# Patient Record
Sex: Female | Born: 1995 | Race: Black or African American | Hispanic: No | Marital: Single | Smoking: Never smoker
Health system: Southern US, Community
[De-identification: ages and names within clinical notes are randomized; demographics above are authoritative.]

---
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
## 2014-06-18 ENCOUNTER — Inpatient Hospital Stay (HOSPITAL_COMMUNITY)
Admission: AD | Admit: 2014-06-18 | Discharge: 2014-06-18 | Disposition: A | Discharge status: Home / Self Care | Payer: Medicaid Other | Source: Ambulatory Visit | Attending: Obstetrics and Gynecology | Admitting: Obstetrics and Gynecology

## 2014-06-18 DIAGNOSIS — R509 Fever, unspecified: Secondary | ICD-10-CM | POA: Insufficient documentation

## 2014-06-18 DIAGNOSIS — R112 Nausea with vomiting, unspecified: Secondary | ICD-10-CM | POA: Insufficient documentation

## 2014-06-18 DIAGNOSIS — K529 Noninfective gastroenteritis and colitis, unspecified: Secondary | ICD-10-CM

## 2014-06-18 LAB — CBC WITH DIFFERENTIAL/PLATELET
BASOS ABS: 0 10*3/uL (ref 0.0–0.1)
BASOS PCT: 0 % (ref 0–1)
EOS PCT: 0 % (ref 0–5)
Eosinophils Absolute: 0 10*3/uL (ref 0.0–0.7)
HCT: 40.4 % (ref 36.0–46.0)
HEMOGLOBIN: 13.7 g/dL (ref 12.0–15.0)
LYMPHS PCT: 8 % — AB (ref 12–46)
Lymphs Abs: 0.8 10*3/uL (ref 0.7–4.0)
MCH: 28.7 pg (ref 26.0–34.0)
MCHC: 33.9 g/dL (ref 30.0–36.0)
MCV: 84.5 fL (ref 78.0–100.0)
MONO ABS: 0.4 10*3/uL (ref 0.1–1.0)
MONOS PCT: 4 % (ref 3–12)
NEUTROS PCT: 88 % — AB (ref 43–77)
Neutro Abs: 8.5 10*3/uL — ABNORMAL HIGH (ref 1.7–7.7)
Platelets: 278 10*3/uL (ref 150–400)
RBC: 4.78 MIL/uL (ref 3.87–5.11)
RDW: 13.1 % (ref 11.5–15.5)
WBC: 9.7 10*3/uL (ref 4.0–10.5)

## 2014-06-18 LAB — COMPREHENSIVE METABOLIC PANEL
ALK PHOS: 66 U/L (ref 39–117)
ALT: 20 U/L (ref 0–35)
AST: 24 U/L (ref 0–37)
Albumin: 4 g/dL (ref 3.5–5.2)
Anion gap: 6 (ref 5–15)
BUN: 11 mg/dL (ref 6–23)
CALCIUM: 8.6 mg/dL (ref 8.4–10.5)
CHLORIDE: 104 mmol/L (ref 96–112)
CO2: 26 mmol/L (ref 19–32)
Creatinine, Ser: 0.97 mg/dL (ref 0.50–1.10)
GFR calc Af Amer: >90 mL/min (ref >90)
GFR, EST NON AFRICAN AMERICAN: 84 mL/min — AB (ref >90)
GLUCOSE: 99 mg/dL (ref 70–99)
POTASSIUM: 3.6 mmol/L (ref 3.5–5.1)
SODIUM: 136 mmol/L (ref 135–145)
TOTAL PROTEIN: 7.7 g/dL (ref 6.0–8.3)
Total Bilirubin: 0.8 mg/dL (ref 0.3–1.2)

## 2014-06-18 LAB — URINE MICROSCOPIC-ADD ON

## 2014-06-18 LAB — URINALYSIS, ROUTINE W REFLEX MICROSCOPIC
Bilirubin Urine: NEGATIVE
Glucose, UA: NEGATIVE mg/dL
HGB URINE DIPSTICK: NEGATIVE
Ketones, ur: NEGATIVE mg/dL
Leukocytes, UA: NEGATIVE
Nitrite: NEGATIVE
Protein, ur: 100 mg/dL — AB
SPECIFIC GRAVITY, URINE: 1.01 (ref 1.005–1.030)
UROBILINOGEN UA: 1 mg/dL (ref 0.0–1.0)
pH: >9 — ABNORMAL HIGH (ref 5.0–8.0)

## 2014-06-18 LAB — POCT PREGNANCY, URINE: Preg Test, Ur: NEGATIVE

## 2014-06-18 MED ORDER — ONDANSETRON 8 MG PO TBDP
8.0000 mg | ORAL_TABLET | Freq: Once | ORAL | Status: AC
  Administered 2014-06-18: 8 mg via ORAL
  Filled 2014-06-18: qty 1

## 2014-06-18 MED ORDER — ONDANSETRON 4 MG PO TBDP: 4.0000 mg | ORAL_TABLET | Freq: Four times a day (QID) | ORAL | Status: AC | PRN

## 2014-06-18 MED ORDER — ACETAMINOPHEN 325 MG PO TABS
650.0000 mg | ORAL_TABLET | Freq: Once | ORAL | Status: AC
  Administered 2014-06-18: 650 mg via ORAL
  Filled 2014-06-18: qty 2

## 2014-06-18 NOTE — MAU Note (Signed)
Pt states she has been throwing up all day, had diarrhea, HA, back pain, dizziness, cramping in right side, and SOB. LMP 05/31/2014. Pt took 2 Bayer Aspirin at 5pm, it did not help. Denies cough, sore throat or cold..Marland Kitchen

## 2014-06-18 NOTE — MAU Provider Note (Signed)
History     CSN: [id number]  Arrival date and time: 06/18/14 0134   None     Chief Complaint  Patient presents with  . Fever  . Emesis   HPI This is a 19 y.o. female who presents with c/o nausea, vomiting and fever since 2pm today. Had one episode of loose stool. No sick contacts or suspicious food.  Denies abdominal pain, though gets fleeting spasm feelings in epigastrum which last a few seconds then stop. No pain in lower abdomen. Denies gynecologic complaints.  RN Note:  Pt states she has been throwing up all day, had diarrhea, HA, back pain, dizziness, cramping in right side, and SOB. LMP 05/31/2014. Pt took 2 Bayer Aspirin at 5pm, it did not help. Denies cough, sore throat or cold..          OB History    No data available      No past medical history on file.  No past surgical history on file.  Family History  Problem Relation Age of Onset  . Diabetes Mother   . Hypertension Mother     History  Substance Use Topics  . Smoking status: Never Smoker   . Smokeless tobacco: Not on file  . Alcohol Use: No    Allergies: No Known Allergies  Prescriptions prior to admission  Medication Sig Dispense Refill Last Dose  . naproxen (NAPROSYN) 500 MG tablet Take 1 tablet (500 mg total) by mouth 2 (two) times daily. 30 tablet 0     Review of Systems  Constitutional: Positive for fever, chills and malaise/fatigue.  HENT: Negative for sore throat.   Respiratory: Negative for cough.   Cardiovascular: Negative for chest pain.  Gastrointestinal: Positive for nausea, vomiting and diarrhea (one stool). Negative for abdominal pain and constipation.  Neurological: Negative for dizziness.   Physical Exam   Blood pressure 121/70, pulse 117, temperature 102.9 F (39.4 C), temperature source Oral, resp. rate 22, height  (1.626 m), weight 200 lb (90.719 kg), SpO2 98 %.  Physical Exam  Constitutional: She is oriented to person, place, and time. She appears  well-developed. No distress.  HENT:  Head: Normocephalic.  Cardiovascular: Normal rate.   Respiratory: Effort normal.  GI: Soft. She exhibits no distension and no mass. There is no tenderness. There is no rebound and no guarding.  Musculoskeletal: Normal range of motion.  Neurological: She is alert and oriented to person, place, and time.  Skin: Skin is warm and dry.  Psychiatric: She has a normal mood and affect.    MAU Course  Procedures  MDM Will check labs and give Zofran then try sips. Has had no vomiting while here. Tylenol given for fever.  Results for orders placed or performed during the hospital encounter of 06/18/14 (from the past 24 hour(s))  Urinalysis, Routine w reflex microscopic     Status: Abnormal   Collection Time: 06/18/14  1:50 AM  Result Value Ref Range   Color, Urine YELLOW YELLOW   APPearance CLEAR CLEAR   Specific Gravity, Urine 1.010 1.005 - 1.030   pH >9.0 (H) 5.0 - 8.0   Glucose, UA NEGATIVE NEGATIVE mg/dL   Hgb urine dipstick NEGATIVE NEGATIVE   Bilirubin Urine NEGATIVE NEGATIVE   Ketones, ur NEGATIVE NEGATIVE mg/dL   Protein, ur 409 (A) NEGATIVE mg/dL   Urobilinogen, UA 1.0 0.0 - 1.0 mg/dL   Nitrite NEGATIVE NEGATIVE   Leukocytes, UA NEGATIVE NEGATIVE  Urine microscopic-add on     Status: None  Collection Time: 06/18/14  1:50 AM  Result Value Ref Range   Squamous Epithelial / LPF RARE RARE   WBC, UA 0-2 <3 WBC/hpf   RBC / HPF 0-2 <3 RBC/hpf   Bacteria, UA RARE RARE  Pregnancy, urine POC     Status: None   Collection Time: 06/18/14  1:56 AM  Result Value Ref Range   Preg Test, Ur NEGATIVE NEGATIVE  CBC with Differential     Status: Abnormal   Collection Time: 06/18/14  2:40 AM  Result Value Ref Range   WBC 9.7 4.0 - 10.5 K/uL   RBC 4.78 3.87 - 5.11 MIL/uL   Hemoglobin 13.7 12.0 - 15.0 g/dL   HCT 40.940.4 81.136.0 - 91.446.0 %   MCV 84.5 78.0 - 100.0 fL   MCH 28.7 26.0 - 34.0 pg   MCHC 33.9 30.0 - 36.0 g/dL   RDW 78.213.1 95.611.5 - 21.315.5 %   Platelets  278 150 - 400 K/uL   Neutrophils Relative % 88 (H) 43 - 77 %   Neutro Abs 8.5 (H) 1.7 - 7.7 K/uL   Lymphocytes Relative 8 (L) 12 - 46 %   Lymphs Abs 0.8 0.7 - 4.0 K/uL   Monocytes Relative 4 3 - 12 %   Monocytes Absolute 0.4 0.1 - 1.0 K/uL   Eosinophils Relative 0 0 - 5 %   Eosinophils Absolute 0.0 0.0 - 0.7 K/uL   Basophils Relative 0 0 - 1 %   Basophils Absolute 0.0 0.0 - 0.1 K/uL  Comprehensive metabolic panel     Status: Abnormal   Collection Time: 06/18/14  2:40 AM  Result Value Ref Range   Sodium 136 135 - 145 mmol/L   Potassium 3.6 3.5 - 5.1 mmol/L   Chloride 104 96 - 112 mmol/L   CO2 26 19 - 32 mmol/L   Glucose, Bld 99 70 - 99 mg/dL   BUN 11 6 - 23 mg/dL   Creatinine, Ser 0.860.97 0.50 - 1.10 mg/dL   Calcium 8.6 8.4 - 57.810.5 mg/dL   Total Protein 7.7 6.0 - 8.3 g/dL   Albumin 4.0 3.5 - 5.2 g/dL   AST 24 0 - 37 U/L   ALT 20 0 - 35 U/L   Alkaline Phosphatase 66 39 - 117 U/L   Total Bilirubin 0.8 0.3 - 1.2 mg/dL   GFR calc non Af Amer 84 (L) >90 mL/min   GFR calc Af Amer >90 >90 mL/min   Anion gap 6 5 - 15   Tolerated ginger ale well. No vomiting while here.  Fever down  Assessment and Plan  A:  Probable viral gastroenteritis  P:  Discussed expected course       Rx Zofran for PRN use at home        Reviewed she should improve in 2-3 days        If worsens or does not get better, go to Urgent Care or ED  [facility] CenterWILLIAMS,Stormy Connon 06/18/2014, 3:40 AM

## 2014-06-18 NOTE — Discharge Instructions (Signed)

## 2017-04-06 ENCOUNTER — Emergency Department (HOSPITAL_COMMUNITY)
Admission: EM | Admit: 2017-04-06 | Discharge: 2017-04-06 | Disposition: A | Discharge status: Home / Self Care | Payer: PPO | Attending: Emergency Medicine | Admitting: Emergency Medicine

## 2017-04-06 ENCOUNTER — Encounter (HOSPITAL_COMMUNITY): Payer: Self-pay | Admitting: Emergency Medicine

## 2017-04-06 DIAGNOSIS — Z202 Contact with and (suspected) exposure to infections with a predominantly sexual mode of transmission: Secondary | ICD-10-CM | POA: Insufficient documentation

## 2017-04-06 DIAGNOSIS — J020 Streptococcal pharyngitis: Secondary | ICD-10-CM | POA: Insufficient documentation

## 2017-04-06 DIAGNOSIS — J029 Acute pharyngitis, unspecified: Secondary | ICD-10-CM | POA: Diagnosis present

## 2017-04-06 DIAGNOSIS — Z79899 Other long term (current) drug therapy: Secondary | ICD-10-CM | POA: Insufficient documentation

## 2017-04-06 LAB — WET PREP, GENITAL
Sperm: NONE SEEN
TRICH WET PREP: NONE SEEN
Yeast Wet Prep HPF POC: NONE SEEN

## 2017-04-06 LAB — RAPID STREP SCREEN (MED CTR MEBANE ONLY): Streptococcus, Group A Screen (Direct): POSITIVE — AB

## 2017-04-06 MED ORDER — CEFTRIAXONE SODIUM 250 MG IJ SOLR
250.0000 mg | Freq: Once | INTRAMUSCULAR | Status: AC
  Administered 2017-04-06: 250 mg via INTRAMUSCULAR
  Filled 2017-04-06: qty 250

## 2017-04-06 MED ORDER — ONDANSETRON 4 MG PO TBDP
4.0000 mg | ORAL_TABLET | Freq: Once | ORAL | Status: AC
  Administered 2017-04-06: 4 mg via ORAL
  Filled 2017-04-06: qty 1

## 2017-04-06 MED ORDER — DEXAMETHASONE 4 MG PO TABS
10.0000 mg | ORAL_TABLET | Freq: Once | ORAL | Status: AC
  Administered 2017-04-06: 10 mg via ORAL
  Filled 2017-04-06: qty 2

## 2017-04-06 MED ORDER — LIDOCAINE HCL (PF) 1 % IJ SOLN
INTRAMUSCULAR | Status: AC
  Administered 2017-04-06: 1.5 mL
  Filled 2017-04-06: qty 5

## 2017-04-06 MED ORDER — AZITHROMYCIN 250 MG PO TABS
1000.0000 mg | ORAL_TABLET | Freq: Once | ORAL | Status: AC
  Administered 2017-04-06: 1000 mg via ORAL
  Filled 2017-04-06: qty 4

## 2017-04-06 MED ORDER — METRONIDAZOLE 500 MG PO TABS: 500.0000 mg | ORAL_TABLET | Freq: Two times a day (BID) | ORAL | 0 refills | Status: AC

## 2017-04-06 MED ORDER — PENICILLIN G BENZATHINE 1200000 UNIT/2ML IM SUSP
1.2000 10*6.[IU] | Freq: Once | INTRAMUSCULAR | Status: AC
  Administered 2017-04-06: 1.2 10*6.[IU] via INTRAMUSCULAR
  Filled 2017-04-06: qty 2

## 2017-04-06 NOTE — ED Provider Notes (Signed)
[facility]-EMERGENCY DEPT Provider Note   CSN: [id number] Arrival date & time: 04/06/17  1600     History   Chief Complaint Chief Complaint  Patient presents with  . Sore Throat  . Exposure to STD    HPI Connie Mckenzie is a 21 y.o. female who presents to the ED for STI screening and treatment. Patient's sex partner recently treated for STI. Patient denies any symptoms but wants to be treated. Patient does report abnormal vaginal bleeding this past week that she describes as spotting.   Patient also reports that she has a sore throat that started yesterday.   HPI  History reviewed. No pertinent past medical history.  There are no active problems to display for this patient.   History reviewed. No pertinent surgical history.  OB History    No data available       Home Medications    Prior to Admission medications   Medication Sig Start Date End Date Taking? Authorizing Provider  naproxen (NAPROSYN) 500 MG tablet Take 1 tablet (500 mg total) by mouth 2 (two) times daily. 06/09/13   Kirichenko, Tatyana, PA-C  ondansetron (ZOFRAN ODT) 4 MG disintegrating tablet Take 1 tablet (4 mg total) by mouth every 6 (six) hours as needed for nausea. 06/18/14   Aviva SignsWilliams, Marie L, CNM    Family History Family History  Problem Relation Age of Onset  . Diabetes Mother   . Hypertension Mother     Social History Social History   Tobacco Use  . Smoking status: Never Smoker  . Smokeless tobacco: Never Used  Substance Use Topics  . Alcohol use: No  . Drug use: Not on file     Allergies   Patient has no known allergies.   Review of Systems Review of Systems  Constitutional: Negative for fever.  HENT: Positive for sore throat.   Eyes: Negative for discharge and redness.  Respiratory: Negative for cough.   Cardiovascular: Negative for chest pain.  Gastrointestinal: Negative for abdominal pain, nausea and vomiting.  Genitourinary: Positive for vaginal  bleeding (spotting). Negative for dysuria and vaginal discharge.  Musculoskeletal: Positive for back pain.  Skin: Negative for rash.  Neurological: Negative for syncope and headaches.  Hematological: Positive for adenopathy.  Psychiatric/Behavioral: Negative for confusion.     Physical Exam Updated Vital Signs BP 125/84 (BP Location: Left Arm)   Pulse 97   Temp 100.3 F (37.9 C) (Oral)   Resp 17   Ht 5' 3.5" (1.613 m)   Wt 80.7 kg (178 lb)   LMP 03/05/2017   SpO2 100%   BMI 31.04 kg/m   Physical Exam  Constitutional: She is oriented to person, place, and time. She appears well-developed and well-nourished. She does not appear ill. No distress.  HENT:  Head: Normocephalic and atraumatic.  Nose: Nose normal.  Mouth/Throat: Uvula is midline. Posterior oropharyngeal erythema present. Tonsils are 3+ on the right. Tonsils are 2+ on the left.  Eyes: Conjunctivae and EOM are normal.  Neck: Normal range of motion. Neck supple.  Cardiovascular: Normal rate.  Pulmonary/Chest: Effort normal.  Abdominal: Soft. There is no tenderness.  Musculoskeletal: Normal range of motion.  Lymphadenopathy:    She has cervical adenopathy.  Neurological: She is alert and oriented to person, place, and time. No cranial nerve deficit.  Skin: Skin is warm and dry.  Psychiatric: She has a normal mood and affect. Her behavior is normal.  Nursing note and vitals reviewed.    ED Treatments /  Results  Labs (all labs ordered are listed, but only abnormal results are displayed) Labs Reviewed  WET PREP, GENITAL - Abnormal; Notable for the following components:      Result Value   Clue Cells Wet Prep HPF POC PRESENT (*)    WBC, Wet Prep HPF POC RARE (*)    All other components within normal limits  RAPID STREP SCREEN (NOT AT [facility]) - Abnormal; Notable for the following components:   Streptococcus, Group A Screen (Direct) POSITIVE (*)    All other components within normal limits  GC/CHLAMYDIA PROBE AMP  ([place]) NOT AT [facility]    EKG  EKG Interpretation None       Radiology No results found.  Procedures Procedures (including critical care time)  Medications Ordered in ED Medications  penicillin g benzathine (BICILLIN LA) 1200000 UNIT/2ML injection 1.2 Million Units (not administered)  dexamethasone (DECADRON) tablet 10 mg (not administered)  cefTRIAXone (ROCEPHIN) injection 250 mg (250 mg Intramuscular Given 04/06/17 1740)  azithromycin (ZITHROMAX) tablet 1,000 mg (1,000 mg Oral Given 04/06/17 1739)  lidocaine (PF) (XYLOCAINE) 1 % injection (1.5 mLs  Given 04/06/17 1740)     Initial Impression / Assessment and Plan / ED Course  I have reviewed the triage vital signs and the nursing notes. Pt presents with concerns for possible STD.  Pt understands that they have GC/Chlamydia cultures pending and that they will need to inform all sexual partners if results return positive. Pt has been treated prophylactically with azithromycin and Rocephin due to pts history, pelvic exam, and wet prep. Pt not concerning for PID because hemodynamically stable and no cervical motion tenderness on pelvic exam. Pt has also been treated with Flagyl for Bacterial Vaginosis. Pt has been advised to not drink alcohol while on this medication.  Patient to be discharged with instructions to follow up with [facility]. Discussed importance of using protection when sexually active.   Pt febrile with tonsillar exudate, cervical lymphadenopathy, & dysphagia; diagnosis of bacterial pharyngitis. Treated in the ED with steroids, NSAIDs, and PCN IM.  Pt appears mildly dehydrated, discussed importance of water rehydration. Presentation non concerning for PTA or RPA. No trismus or uvula deviation. Specific return precautions discussed. Pt able to drink water in ED without difficulty with intact air way. Recommended PCP follow up.   Final Clinical Impressions(s) / ED Diagnoses   Final diagnoses:  Exposure to STD  Strep  pharyngitis    ED Discharge Orders    None       Kerrie Buffaloeese, Hope GreenlandM, [place]NP 04/06/17 Berna [id number]    Alvira MondaySchlossman, Erin, MD 04/07/17 1438

## 2017-04-06 NOTE — Discharge Instructions (Signed)
[facility]. Do not drink alcohol while taking the medication because it can make you very sick if you do. If your cultures are positive someone will call you.

## 2017-04-06 NOTE — ED Triage Notes (Signed)
Patient c/o sore throat that started last night and pain got worse today. Patient states that her sexual partner was diagnosed with NGU-nongonococcal urethritis and she needs to be treated. Denies any STD/vaginal issues.

## 2017-04-08 ENCOUNTER — Ambulatory Visit: Payer: Self-pay | Admitting: Family Medicine

## 2017-04-08 LAB — GC/CHLAMYDIA PROBE AMP (~~LOC~~) NOT AT ARMC
CHLAMYDIA, DNA PROBE: NEGATIVE
Neisseria Gonorrhea: NEGATIVE

## 2017-06-08 ENCOUNTER — Other Ambulatory Visit: Payer: Self-pay | Admitting: Obstetrics & Gynecology
